# Patient Record
Sex: Male | Born: 1985 | Race: White | Hispanic: No | Marital: Single | Smoking: Current some day smoker
Health system: Southern US, Community
[De-identification: ages and names within clinical notes are randomized; demographics above are authoritative.]

## PROBLEM LIST (undated history)

## (undated) DIAGNOSIS — F32A Depression, unspecified: Secondary | ICD-10-CM

## (undated) DIAGNOSIS — K449 Diaphragmatic hernia without obstruction or gangrene: Secondary | ICD-10-CM

## (undated) DIAGNOSIS — F419 Anxiety disorder, unspecified: Secondary | ICD-10-CM

## (undated) DIAGNOSIS — R51 Headache: Secondary | ICD-10-CM

## (undated) DIAGNOSIS — B009 Herpesviral infection, unspecified: Secondary | ICD-10-CM

## (undated) DIAGNOSIS — K2270 Barrett's esophagus without dysplasia: Secondary | ICD-10-CM

## (undated) DIAGNOSIS — F329 Major depressive disorder, single episode, unspecified: Secondary | ICD-10-CM

## (undated) HISTORY — DX: Barrett's esophagus without dysplasia: K22.70

## (undated) HISTORY — DX: Major depressive disorder, single episode, unspecified: F32.9

## (undated) HISTORY — DX: Depression, unspecified: F32.A

## (undated) HISTORY — DX: Herpesviral infection, unspecified: B00.9

## (undated) HISTORY — DX: Anxiety disorder, unspecified: F41.9

## (undated) HISTORY — DX: Headache: R51

---

## 2007-07-29 ENCOUNTER — Encounter: Admission: RE | Admit: 2007-07-29 | Discharge: 2007-07-29 | Payer: Self-pay | Admitting: Gastroenterology

## 2011-10-29 ENCOUNTER — Encounter (HOSPITAL_COMMUNITY): Payer: Self-pay | Admitting: Psychology

## 2011-10-29 ENCOUNTER — Ambulatory Visit (INDEPENDENT_AMBULATORY_CARE_PROVIDER_SITE_OTHER): Payer: 59 | Admitting: Psychology

## 2011-10-29 DIAGNOSIS — F329 Major depressive disorder, single episode, unspecified: Secondary | ICD-10-CM

## 2011-10-29 DIAGNOSIS — F1010 Alcohol abuse, uncomplicated: Secondary | ICD-10-CM

## 2011-10-29 NOTE — Progress Notes (Signed)
Charles Howe is a 25 y.o. male in treatment for depression and anxiety and displays the following risk factors for Suicide:  Demographic factors:  Male, Adolescent or young adult and Caucasian Current Mental Status: Suicidal ideation Loss Factors: Loss of significant relationship Historical Factors: Prior suicide attempts Risk Reduction Factors: Employed and Living with another person, especially a relative  CLINICAL FACTORS:  Depression:   Aggression Comorbid alcohol abuse/dependence Alcohol/Substance Abuse/Dependencies  COGNITIVE FEATURES THAT CONTRIBUTE TO RISK: Loss of executive function    SUICIDE RISK:  Mild:  Suicidal ideation of limited frequency, intensity, duration, and specificity.  There are no identifiable plans, no associated intent, mild dysphoria and related symptoms, good self-control (both objective and subjective assessment), few other risk factors, and identifiable protective factors, including available and accessible social support.  Mental Status:  General Appearance Charles Howe:  Neat Eye Contact:  Good Motor Behavior:  Normal Speech:  Normal Level of Consciousness:  Alert Mood:  Anxious Affect:  Appropriate Anxiety Level:  Moderate Thought Process:  Coherent, Relevant and Intact Thought Content:  WNL Perception:  Normal Judgment:  Good Insight:  Present Cognition:  Orientation time, place and person Memory Remote Concentration Yes Sleep: 5-6 hours, feels it is enough  Reviewed his self-reported history with a goal of developing rapport and identifying immediate needs.  Onset of current problem is 2+ months ago when he engaged in Unacceptable, unfaithful behavior and his girlfriend confronted him about it.  She attributed it to his alcohol and drug use and gave the ultimatum of giving up marijuana and starting to work on some real goals for his life.  She is pursuing a nursing degree with a goal of becoming a nurse anesthetist.  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   He comes in today because he sees himself as having an anger problem, saying he is easily frustrated and when angry, "over little things" he hits things and has "some messed up knuckles".  During the past few months, he lost his appetite and lost 30 lbs, but has regained 10.   He worries a lot about his physical health, having Barrett's disease and a hiatal hernia  "related to smoking and alcohol".  He lives with his mother and step father (who drinks and smokes marijuana daily).  He pays his own bills, but is also beginning to need to help them out.  Onset of his own difficulties he reports to be the death of his father when he was 10.  He says his father had been an alcoholic, but had stopped drinking, went to AA and a short time later died in his sleep of what was mixing pain meds with other substances.  He had a lot of difficulty with his behavior in school after that.  The stepdad came into their lives two weeks after his father died and married his mother a year later.  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  He denies and current or past history of blackouts, DUIs or other legal problems.  He likes to bowl weekly and play golf as often as he can afford to.  He says he was good at drawing in school, because that was what he did most of the time during classes.  His completion of the paperwork initially indicates lack of attention to details or high anxiety.  He reports one prior attempt at therapy, "but she wanted me to stop drinking and give up marijuana and that was not why I was there."  Current stressors include a boss who smokes marijuana at work, the attempt to please his girlfriend and a lack of feeling worthy of her, his health issues and the suicidal ideation that comes at times.  His has a friend who is taking him to church, something he  never did with his family.  He says it makes him feel hopeful and he enjoys going.  Goals discussed for our work together:  Experience motivation to do things;  Experience less frustration and behave with less anger; "I want to be happy without a buzz"  PLAN OF CARE:Education provided about the increased risk of suicidal gestures if using mind altering substances like alcohol due to loss of executive function.  Patient has wisely avoided purchase of a gun, even for hunting (uses bow to hunt dear) and does not have immediate acces to firearms in his home. Only attempt was at age 67 with the chain of a watch which broke when he put weight on it.  Denies any current intent or even thoughts at this time.  Most recent was several months ago when his girlfriend broke up with him.  I outlined the expectations from therapy, the limited time frame (6-8 session), the active participation needed including with homework, and the benefit he might obtain from having support and coaching as he makes changes.  I think he may need further assessment for ADD in the future.  I did not talk about medication for depression as our session was shorter than expected because he spent the first part of the hour working on the paperwork.   [facility], RN 10/29/2011, 4:37 PM

## 2011-12-12 ENCOUNTER — Ambulatory Visit (HOSPITAL_COMMUNITY): Payer: 59 | Admitting: Psychology

## 2012-01-16 ENCOUNTER — Encounter (HOSPITAL_COMMUNITY): Payer: Self-pay | Admitting: Psychology

## 2012-01-16 ENCOUNTER — Ambulatory Visit (INDEPENDENT_AMBULATORY_CARE_PROVIDER_SITE_OTHER): Payer: 59 | Admitting: Psychology

## 2012-01-16 DIAGNOSIS — F3289 Other specified depressive episodes: Secondary | ICD-10-CM

## 2012-01-16 DIAGNOSIS — F329 Major depressive disorder, single episode, unspecified: Secondary | ICD-10-CM

## 2012-01-16 DIAGNOSIS — F411 Generalized anxiety disorder: Secondary | ICD-10-CM

## 2012-01-16 DIAGNOSIS — F1010 Alcohol abuse, uncomplicated: Secondary | ICD-10-CM

## 2012-01-16 DIAGNOSIS — F32A Depression, unspecified: Secondary | ICD-10-CM

## 2012-01-16 NOTE — Progress Notes (Signed)
   THERAPIST PROGRESS NOTE  Session Time: 1505 - 1615  Participation Level: Active  Behavioral Response: Casual and Well GroomedAlertDepressed  Type of Therapy: Individual Therapy  Treatment Goals addressed: Anxiety and Coping  Interventions: CBT  Summary: Charles Howe is a 26 y.o. male who presents with sadness and remorse over having cheated with another woman when he was very drunk and thus lost his girlfriend.  This happened about one month ago, "I don't know why I did it", and is the third time this has happened, each time related to excessive alcohol intake.  He is angry with himself, ruminating about the "why", not eating, not sleeping well, and having difficulty concentrating at school and at work.  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  He continues to work and denies missing work or school related to recent problems.  He has been tearful a lot in situations where he would not want to be. He cannot help himself from checking her facebook page daily and looking for messages from her.  While in our session he did receive a reply to a text message he sent because today is her birthday (21) and then had a call from her.  He was very appropriate and our session was just ending anyway.. Further family history indicates his anxiety disorder is at the baseline and is something his mother also suffers with.  On occasion she will give him a benzodiazepine, which works well for him.  He is currently suffering a great deal and has used marijuana to deal with it on occasion.  Suicidal/Homicidal: Yes , without intent/plan   "I think about it all the time, but I know I wouldn't do anything".  This is much the same answer as on first interview.  Therapist Response:  We talked over the circumstances, as well as the pattern of drinking too much and behaving badly.  It is not impossible that his friends could have set him up, but he doesn't like to think  that.    Suggested inpatient admission:, suggested medication via PCP, suggested that he start reading books by Allyson Sabal dealing with anxiety treatment using CBT.  He did not want to come inpatient or take medication.  Gave him written note listing names.  Also suggested aerobic exercise to help treat his anxiety and difficulty sleeping, take his dog for long walks, running, etc.  Contracted with him to stop using alcohol even on the weekends for the time being, at least.  He agreed to this.  Plan: Return again in 2 weeks.  Diagnosis: Axis I: Generalized Anxiety Disorder    Axis II: Deferred    Theola Cuellar, RN 01/16/2012

## 2012-01-31 ENCOUNTER — Ambulatory Visit (INDEPENDENT_AMBULATORY_CARE_PROVIDER_SITE_OTHER): Payer: 59 | Admitting: Psychology

## 2012-01-31 ENCOUNTER — Ambulatory Visit (HOSPITAL_COMMUNITY): Payer: Self-pay | Admitting: Psychology

## 2012-01-31 DIAGNOSIS — T50904A Poisoning by unspecified drugs, medicaments and biological substances, undetermined, initial encounter: Secondary | ICD-10-CM

## 2012-01-31 DIAGNOSIS — F1994 Other psychoactive substance use, unspecified with psychoactive substance-induced mood disorder: Secondary | ICD-10-CM

## 2012-01-31 NOTE — Progress Notes (Signed)
   THERAPIST PROGRESS NOTE  Session Time: 1305 - 1345  Participation Level: Active  Behavioral Response: CasualDrowsyDepressed  Type of Therapy: Individual Therapy  Treatment Goals addressed: Coping and Diagnosis: substance abuse  Interventions: CBT, Solution Focused, Reframing and Other: confrontation and challenge  Summary: Charles Howe is a 26 y.o. male who presents with "feeling more depressed the last few days.  Missed school one day and work two days".  Revealed that he began feeling more hopeless when girlfriend (estranged) was firm about not planning to see him again.  Admits he has been drinking and smoking marijuana with his friends and got drunk last night.  Admits that these behaviors are interfering with school and work.  Does not admit that he has a problem with substances. Denies intent to hurt himself.  States he did try Lexapro, given by his PCP ("she didn't like the suggestion of Remeron"), and took it for four days, but it made him feel "out of it", while also lessening his ruminations .  States he does not want to try medication again.  Suicidal/Homicidal: No, without intent/plan  Therapist Response: Confronted him about medicating on his own with alcohol and marijuana while still telling me that when he drinks he makes poor decisions.  Reviewed the other things we talked about last week that might help him feel less depressed.  He didn't try anything because of his lack of motivation.  Explained the negative thinking connected with feeling worse and we did some reframing of these statements.  Also did  A solution oriented imagining of what it would be like to actually feel better and have motivation.  Reminded him about the depressant effect of his substance use, which he agreed with.   Finally, challenged him to try an experiment to determine how he would feel if he did not smoke or drink for just 14 days.  His objection was that his parents both smoke and drink and so do  his friends, so it would be hard for him.  Offered suggestion that he stay with someone else while trying the experiment.  He said the family that has been taking him to church offered for him to stay "if I needed to get away.  But I wouldn't want to be a burden on them."  Suggested they might really feel good about helping him, rather than burdened.  Left the ball in his court, to follow through or not.  Plan: Return again in 3 weeks.  Diagnosis: Axis I: Substance Induced Mood Disorder    Axis II: No diagnosis    Charles Alen, RN 01/31/2012

## 2012-02-25 ENCOUNTER — Ambulatory Visit (INDEPENDENT_AMBULATORY_CARE_PROVIDER_SITE_OTHER): Payer: 59 | Admitting: Psychology

## 2012-02-25 DIAGNOSIS — F411 Generalized anxiety disorder: Secondary | ICD-10-CM

## 2012-02-25 NOTE — Progress Notes (Signed)
   THERAPIST PROGRESS NOTE  Session Time: 1410 - 1550  Participation Level: Active  Behavioral Response: CasualAlertAnxious and Depressed  Type of Therapy: Individual Therapy  Treatment Goals addressed: Anxiety and Coping  Interventions: Supportive, Reframing and Other: alcohol and drug issues discussed  Summary: Charles Howe is a 26 y.o. male who presents with continued obsessive thinking about ex-girlfriend.  This is now exacerbated because she called him a week ago wanting to get together.  He said "no", but since then sees her acting foolishly with regard to alcohol and other men and can't stop thinking about her.  He says he has let her know that he is worried about her behavior, but she tells him it is none of his business.  He says he finds himself going to work and school and doing nothing else, but smoking marijuana in his room alone.  Agrees that he needs to be trying some activity.  Since the Lexapro caused him to have more anxiety, he tried talking to his Dr. About something else for anxiety, but she told him no.  He agreed to see provider in this office to discuss options.  His mother takes Diazepam and sometimes gives him 1/2 tab. of hers which is effective.  Suicidal/Homicidal: Nowithout intent/plan  Therapist Response:  Clarified extent of drug use:  Now back to using daily, and for most of the past 2-3 years. We talked about the pros and cons of this use and he admits to beginning to see his motivation for school decreasing.  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  There is no homework.  Talked about his own goals for his future -- get a high paying job seems to be the only one.  He has bought a Sales promotion account executive recently and admits to little concern for safety when riding.  Talked with him about using exercise to help himself with anxiety and depression.  One option he has is running when the weather improves.   Plan: Return again in 2-3  weeks.  Set up appointment with medication provider in this office.   Homework: come up with at least one other activity he will do when not working.    Diagnosis: Axis I: Generalized Anxiety Disorder,  Marijuana and alcohol abuse    Axis II: deferred    [facility], RN 02/25/2012

## 2012-03-24 ENCOUNTER — Ambulatory Visit (HOSPITAL_COMMUNITY): Payer: Self-pay | Admitting: Psychology

## 2012-03-27 ENCOUNTER — Ambulatory Visit (HOSPITAL_COMMUNITY): Payer: Self-pay | Admitting: Psychology

## 2012-04-15 ENCOUNTER — Ambulatory Visit (HOSPITAL_COMMUNITY): Payer: Self-pay | Admitting: Physician Assistant

## 2012-09-10 ENCOUNTER — Ambulatory Visit
Admission: RE | Admit: 2012-09-10 | Discharge: 2012-09-10 | Disposition: A | Discharge status: Home / Self Care | Payer: 59 | Source: Ambulatory Visit | Attending: Family Medicine | Admitting: Family Medicine

## 2012-09-10 ENCOUNTER — Other Ambulatory Visit: Payer: Self-pay | Admitting: Family Medicine

## 2012-09-10 DIAGNOSIS — R52 Pain, unspecified: Secondary | ICD-10-CM

## 2018-09-25 DIAGNOSIS — K219 Gastro-esophageal reflux disease without esophagitis: Secondary | ICD-10-CM | POA: Diagnosis not present

## 2018-09-25 DIAGNOSIS — K2270 Barrett's esophagus without dysplasia: Secondary | ICD-10-CM | POA: Diagnosis not present

## 2018-09-25 DIAGNOSIS — R05 Cough: Secondary | ICD-10-CM | POA: Diagnosis not present

## 2019-02-19 DIAGNOSIS — R05 Cough: Secondary | ICD-10-CM | POA: Diagnosis not present

## 2019-04-14 DIAGNOSIS — M25571 Pain in right ankle and joints of right foot: Secondary | ICD-10-CM | POA: Diagnosis not present

## 2019-06-02 ENCOUNTER — Ambulatory Visit (INDEPENDENT_AMBULATORY_CARE_PROVIDER_SITE_OTHER): Payer: 59 | Admitting: Pulmonary Disease

## 2019-06-02 ENCOUNTER — Encounter: Payer: Self-pay | Admitting: Pulmonary Disease

## 2019-06-02 ENCOUNTER — Ambulatory Visit (INDEPENDENT_AMBULATORY_CARE_PROVIDER_SITE_OTHER): Payer: Self-pay

## 2019-06-02 ENCOUNTER — Other Ambulatory Visit: Payer: Self-pay

## 2019-06-02 VITALS — BP 128/86 | HR 73 | Temp 98.3°F | Ht 69.0 in | Wt 192.4 lb

## 2019-06-02 DIAGNOSIS — K2100 Gastro-esophageal reflux disease with esophagitis, without bleeding: Secondary | ICD-10-CM

## 2019-06-02 DIAGNOSIS — R059 Cough, unspecified: Secondary | ICD-10-CM

## 2019-06-02 DIAGNOSIS — R05 Cough: Secondary | ICD-10-CM

## 2019-06-02 DIAGNOSIS — K219 Gastro-esophageal reflux disease without esophagitis: Secondary | ICD-10-CM | POA: Insufficient documentation

## 2019-06-02 DIAGNOSIS — R0989 Other specified symptoms and signs involving the circulatory and respiratory systems: Secondary | ICD-10-CM | POA: Insufficient documentation

## 2019-06-02 MED ORDER — DOXYCYCLINE HYCLATE 100 MG PO TABS: 100.0000 mg | ORAL_TABLET | Freq: Every day | ORAL | 0 refills | Status: DC

## 2019-06-02 MED ORDER — ALBUTEROL SULFATE HFA 108 (90 BASE) MCG/ACT IN AERS: 2.0000 | INHALATION_SPRAY | Freq: Four times a day (QID) | RESPIRATORY_TRACT | 1 refills | Status: DC | PRN

## 2019-06-02 NOTE — Assessment & Plan Note (Signed)
I have asked him to double up on his omeprazole to twice daily and also undertake precautions for reflux.

## 2019-06-02 NOTE — Patient Instructions (Signed)
Increase omeprazole 40 mg twice daily Other precautions for reflux including small meals, avoid foods that make reflux worse and at least 2 hours between your last meal and bedtime  Doxycycline 100 mg daily for 7 days Albuterol MDI 2 puffs every 6 hours as needed for shortness of breath or wheezing  If symptoms persist then call us and we will schedule breathing test

## 2019-06-02 NOTE — Assessment & Plan Note (Signed)
Chest x-ray personally reviewed which only shows prominent bronchovascular markings, no infiltrate. His symptoms of chest congestion are probably related to bronchitis which could be triggered by allergies or GERD/subclinical aspiration he does have a strong history of reflux due to Barrett's esophagus and had no hernia   We will treat him with doxycycline for 7 days Use albuterol on an as-needed basis, if symptoms persist we may consider PFTs Can cessation were strongly emphasized

## 2019-06-02 NOTE — Progress Notes (Signed)
Subjective:    Patient ID: Charles Howe, male    DOB: 07-19-1986, 33 y.o.   MRN: [id number]  HPI  Chief Complaint  Patient presents with  . Consult    Pt referred by Dr. Hyman HopesWebb for persistant cough x 1-2 months. Pt c/o prod cough with yellow mucus, chest congestion, sinus congestion, wheezing. Pt denies f/c/s, orthopnea, chest pain.     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 He works for a core and is currently on furlough due to the covered pandemic.  He reports significant allergies for many years during spring and fall which manifests as nasal congestion and itchy eyes.  He had these symptoms started about 3 months ago and his chest congestion has persisted since then.  He also reports a dry cough very occasionally productive of minimal amounts of yellow sputum.  He has occasional wheezing.  He does not describe significant dyspnea.  There is no history of chest pain, fevers, orthopnea or paroxysmal nocturnal dyspnea or pedal edema.  He has been social distancing and denies sick contacts  He smoked heavily from age [age] and now is only a social smoker, less than 10 packs/day. I note chest x-ray from 01/2008 which shows hyperinflation and no infiltrates He reports significant reflux and has a history of hiatal hernia and Barrett's esophagus.  He takes omeprazole daily   Past Medical History:  Diagnosis Date  . Anxiety   . Barrett's esophagus   . Depression   . Headache(784.0)   . HSV-1 infection     History reviewed. No pertinent surgical history.  No Known Allergies  Social History   Socioeconomic History  . Marital status: Unknown    Spouse name: Not on file  . Number of children: 0  . Years of education: 8513  . Highest education level: Not on file  Occupational History  . Occupation: Producer, television/film/videometalworker    Employer: [employer]    Comment: 5-6 years full time  Social Needs  . Financial resource strain: Not on file  . Food  insecurity    Worry: Not on file    Inability: Not on file  . Transportation needs    Medical: Not on file    Non-medical: Not on file  Tobacco Use  . Smoking status: Current Some Day Smoker    Packs/day: 1.50    Years: 5.00    Pack years: 7.50    Types: Cigarettes    Start date: 12/03/2003  . Tobacco comment: Social smoking   Substance and Sexual Activity  . Alcohol use: Yes    Alcohol/week: 2.0 - 12.0 standard drinks    Types: 2 - 12 Standard drinks or equivalent per week    Comment: may drink one or two beers weeknights and often 12 pack on Friday and Sat with friends  . Drug use: Yes    Frequency: 5.0 times per week    Types: Marijuana    Comment: stopped about 2 1/2 months ago, only occasional use since then  . Sexual activity: Yes    Partners: Female  Lifestyle  . Physical activity    Days per week: Not on file    Minutes per session: Not on file  . Stress: Not on file  Relationships  . Social Musicianconnections    Talks on phone: Not on file    Gets together: Not on file    Attends religious service: Not on file    Active member of club or  organization: Not on file    Attends meetings of clubs or organizations: Not on file    Relationship status: Not on file  . Intimate partner violence    Fear of current or ex partner: Not on file    Emotionally abused: Not on file    Physically abused: Not on file    Forced sexual activity: Not on file  Other Topics Concern  . Not on file  Social History Narrative  . Not on file     Family History  Problem Relation Age of Onset  . Anxiety disorder Mother   . Depression Mother    Lung cancer in a maternal grandfather  Review of Systems   Constitutional: negative for anorexia, fevers and sweats  Eyes: negative for irritation, redness and visual disturbance  Ears, nose, mouth, throat, and face: negative for earaches, epistaxis, nasal congestion and sore throat  Respiratory: negative for  dyspnea on exertion, sputum and  wheezing  Cardiovascular: negative for chest pain, dyspnea, lower extremity edema, orthopnea, palpitations and syncope  Gastrointestinal: negative for abdominal pain, constipation, diarrhea, melena, nausea and vomiting  Genitourinary:negative for dysuria, frequency and hematuria  Hematologic/lymphatic: negative for bleeding, easy bruising and lymphadenopathy  Musculoskeletal:negative for arthralgias, muscle weakness and stiff joints  Neurological: negative for coordination problems, gait problems, headaches and weakness  Endocrine: negative for diabetic symptoms including polydipsia, polyuria and weight loss     Objective:   Physical Exam   Gen. Pleasant, well-nourished, in no distress, normal affect ENT - no pallor,icterus, no post nasal drip Neck: No JVD, no thyromegaly, no carotid bruits Lungs: no use of accessory muscles, no dullness to percussion, clear without rales or rhonchi  Cardiovascular: Rhythm regular, heart sounds  normal, no murmurs or gallops, no peripheral edema Abdomen: soft and non-tender, no hepatosplenomegaly, BS normal. Musculoskeletal: No deformities, no cyanosis or clubbing, tattoos+  Neuro:  alert, non focal       Assessment & Plan:

## 2019-10-22 ENCOUNTER — Other Ambulatory Visit: Payer: Self-pay

## 2019-10-22 DIAGNOSIS — Z20822 Contact with and (suspected) exposure to covid-19: Secondary | ICD-10-CM

## 2019-10-25 ENCOUNTER — Other Ambulatory Visit: Payer: Self-pay

## 2019-10-25 DIAGNOSIS — Z20822 Contact with and (suspected) exposure to covid-19: Secondary | ICD-10-CM

## 2019-10-25 LAB — NOVEL CORONAVIRUS, NAA: SARS-CoV-2, NAA: NOT DETECTED

## 2019-10-27 LAB — NOVEL CORONAVIRUS, NAA: SARS-CoV-2, NAA: DETECTED — AB

## 2019-11-03 ENCOUNTER — Telehealth: Payer: Self-pay | Admitting: *Deleted

## 2019-11-03 NOTE — Telephone Encounter (Signed)
Charles Howe from [facility], call for the patient's contact number .

## 2020-01-14 ENCOUNTER — Ambulatory Visit (INDEPENDENT_AMBULATORY_CARE_PROVIDER_SITE_OTHER): Payer: 59 | Admitting: Psychology

## 2020-01-14 DIAGNOSIS — F331 Major depressive disorder, recurrent, moderate: Secondary | ICD-10-CM

## 2020-01-14 DIAGNOSIS — F411 Generalized anxiety disorder: Secondary | ICD-10-CM

## 2020-01-21 ENCOUNTER — Ambulatory Visit (INDEPENDENT_AMBULATORY_CARE_PROVIDER_SITE_OTHER): Payer: 59 | Admitting: Psychology

## 2020-01-21 DIAGNOSIS — F331 Major depressive disorder, recurrent, moderate: Secondary | ICD-10-CM | POA: Diagnosis not present

## 2020-02-03 ENCOUNTER — Ambulatory Visit (INDEPENDENT_AMBULATORY_CARE_PROVIDER_SITE_OTHER): Payer: 59 | Admitting: Psychology

## 2020-02-03 DIAGNOSIS — F331 Major depressive disorder, recurrent, moderate: Secondary | ICD-10-CM | POA: Diagnosis not present

## 2020-02-09 ENCOUNTER — Other Ambulatory Visit: Payer: Self-pay | Admitting: Family Medicine

## 2020-02-09 ENCOUNTER — Ambulatory Visit
Admission: RE | Admit: 2020-02-09 | Discharge: 2020-02-09 | Disposition: A | Discharge status: Home / Self Care | Payer: 59 | Source: Ambulatory Visit | Attending: Family Medicine | Admitting: Family Medicine

## 2020-02-09 DIAGNOSIS — R0789 Other chest pain: Secondary | ICD-10-CM

## 2020-02-11 ENCOUNTER — Ambulatory Visit (INDEPENDENT_AMBULATORY_CARE_PROVIDER_SITE_OTHER): Payer: 59 | Admitting: Psychology

## 2020-02-11 DIAGNOSIS — F331 Major depressive disorder, recurrent, moderate: Secondary | ICD-10-CM | POA: Diagnosis not present

## 2020-02-17 ENCOUNTER — Ambulatory Visit (INDEPENDENT_AMBULATORY_CARE_PROVIDER_SITE_OTHER): Payer: 59 | Admitting: Psychology

## 2020-02-17 DIAGNOSIS — F331 Major depressive disorder, recurrent, moderate: Secondary | ICD-10-CM | POA: Diagnosis not present

## 2020-02-25 ENCOUNTER — Ambulatory Visit (INDEPENDENT_AMBULATORY_CARE_PROVIDER_SITE_OTHER): Payer: 59 | Admitting: Psychology

## 2020-02-25 DIAGNOSIS — F331 Major depressive disorder, recurrent, moderate: Secondary | ICD-10-CM | POA: Diagnosis not present

## 2020-03-31 ENCOUNTER — Ambulatory Visit: Payer: 59 | Admitting: Psychology

## 2020-04-07 ENCOUNTER — Ambulatory Visit: Payer: Self-pay | Admitting: Psychology

## 2020-04-21 ENCOUNTER — Ambulatory Visit (INDEPENDENT_AMBULATORY_CARE_PROVIDER_SITE_OTHER): Payer: 59 | Admitting: Psychology

## 2020-04-21 DIAGNOSIS — F331 Major depressive disorder, recurrent, moderate: Secondary | ICD-10-CM | POA: Diagnosis not present

## 2020-06-12 ENCOUNTER — Ambulatory Visit (INDEPENDENT_AMBULATORY_CARE_PROVIDER_SITE_OTHER): Payer: 59 | Admitting: Psychology

## 2020-06-12 DIAGNOSIS — F331 Major depressive disorder, recurrent, moderate: Secondary | ICD-10-CM

## 2020-06-13 ENCOUNTER — Ambulatory Visit: Payer: 59 | Admitting: Psychology

## 2020-06-23 IMAGING — CR DG RIBS W/ CHEST 3+V*L*
3 series · 3 of 3 positions shown · non-contrast
Comparison: June 02, 2019

CLINICAL DATA: Pain following recent snowboarding injury

EXAM:
LEFT RIBS AND CHEST - 3+ VIEW

[w chest pa]
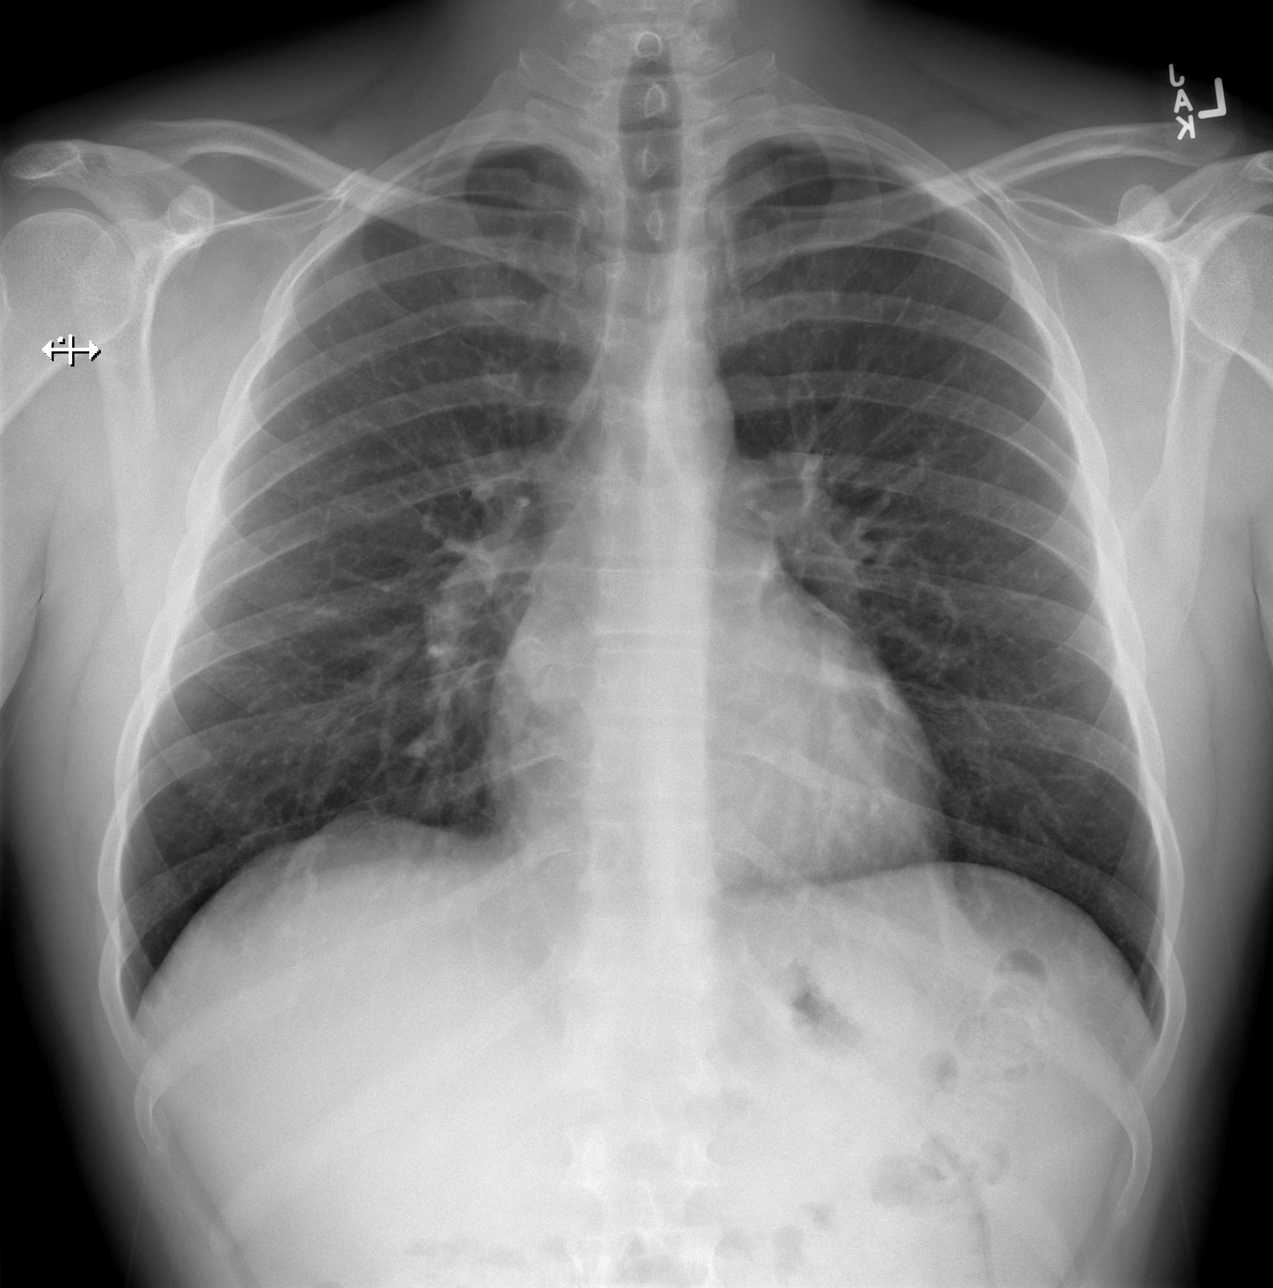

[w ribs ap upper left]
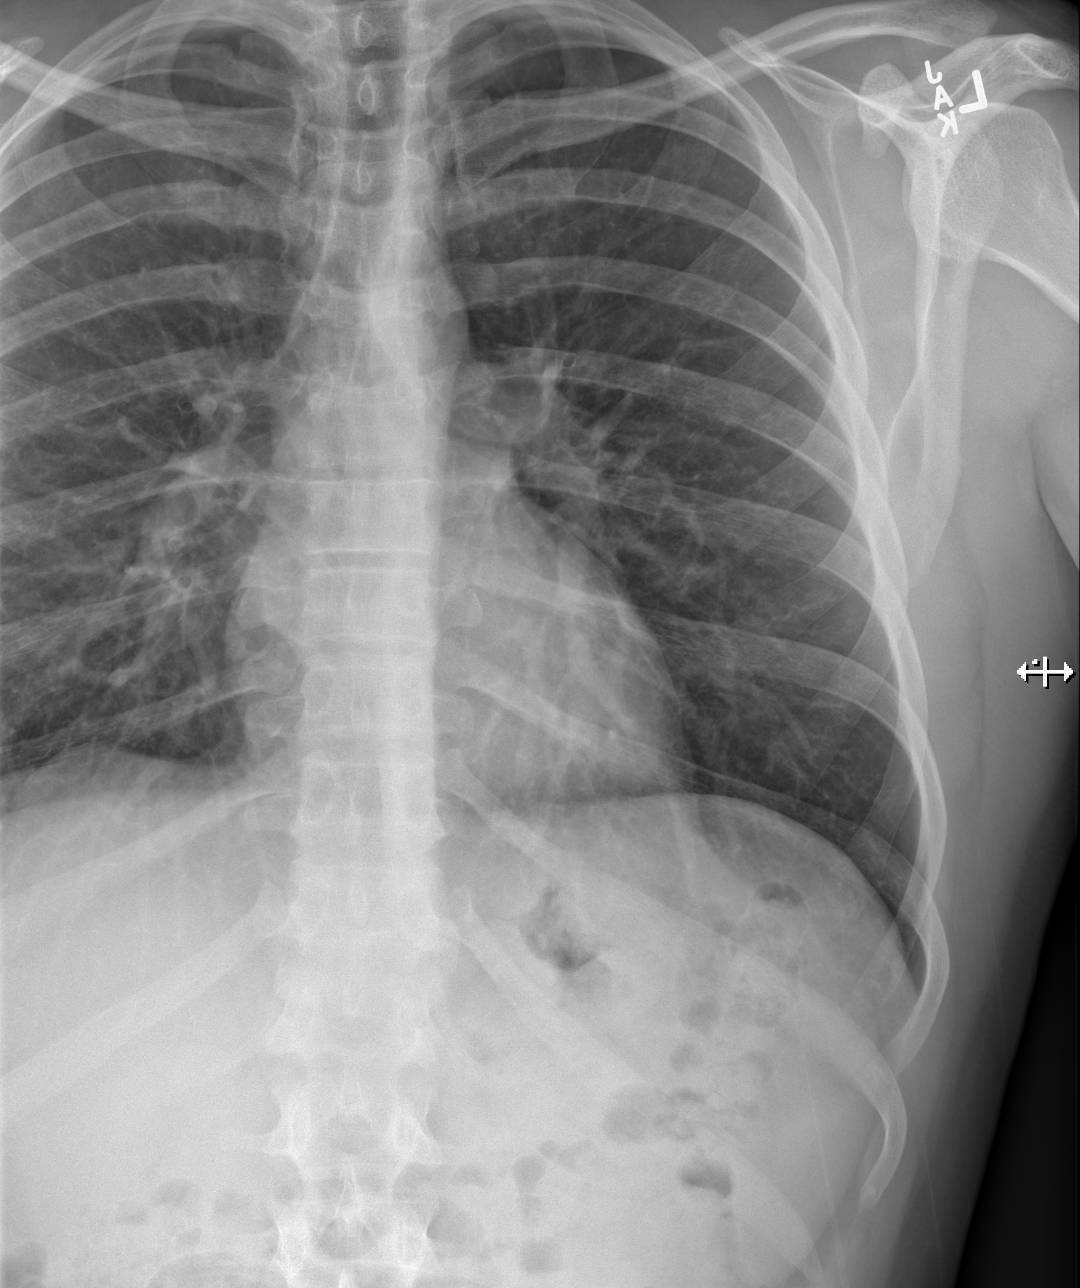

[w ribs ap lower left]
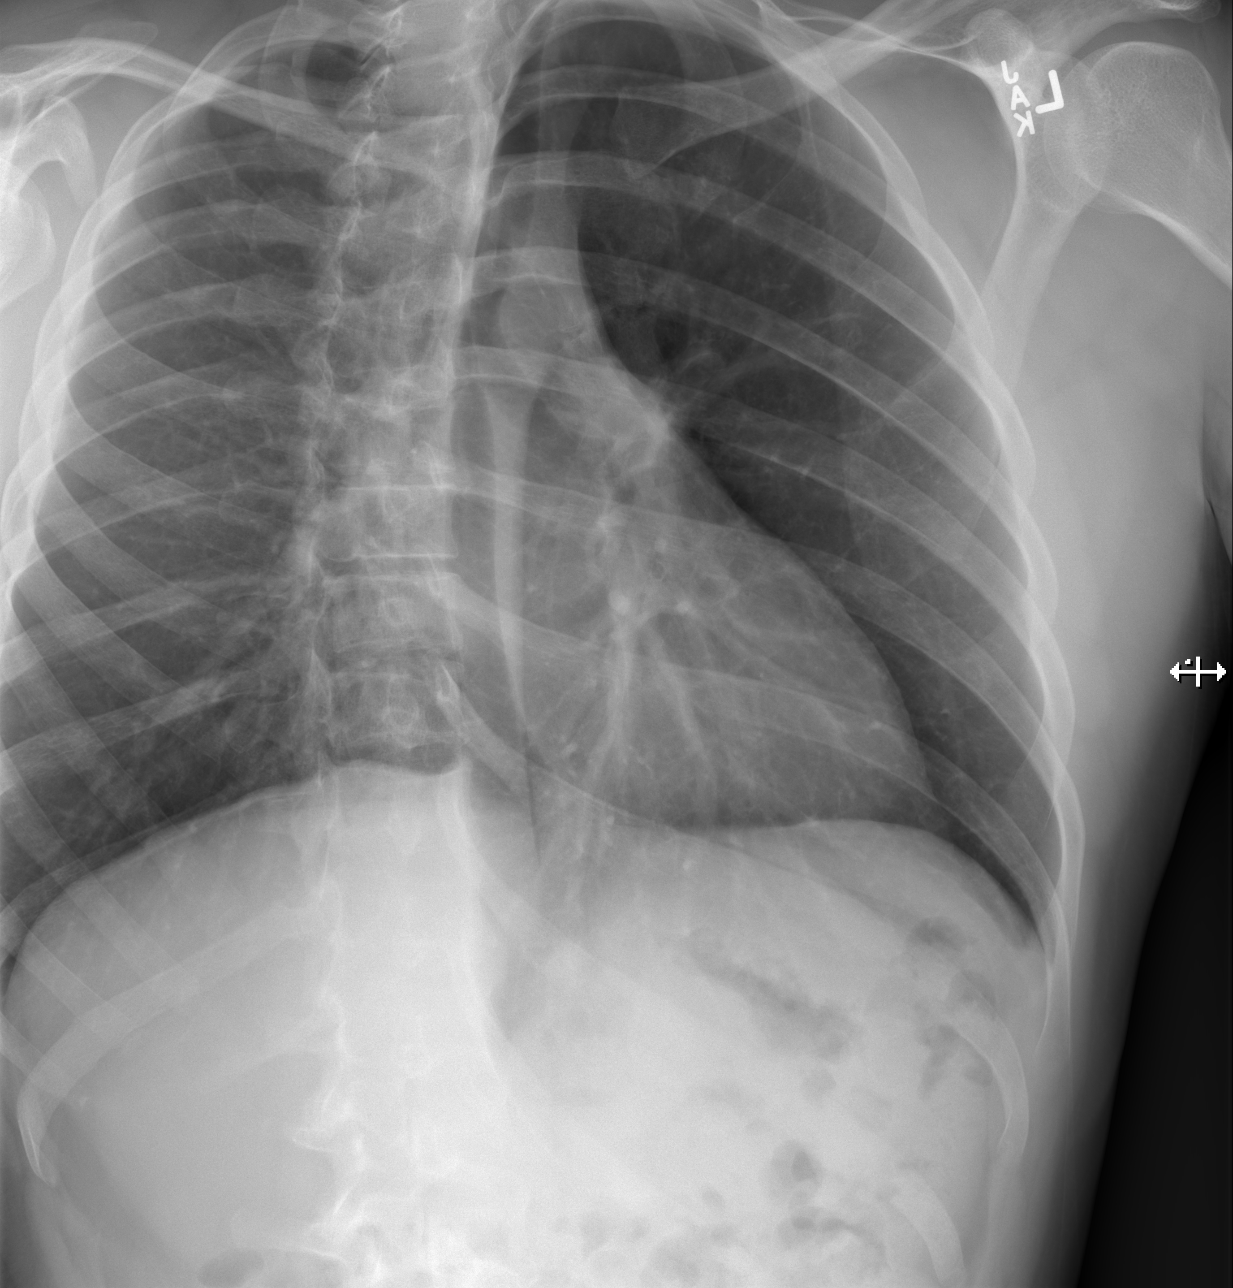

[3 of 3 positions shown; findings below may reference images not displayed]

FINDINGS: Frontal chest as well as oblique and cone-down rib images obtained.
Lungs are clear. Heart size and pulmonary vascularity are normal. No
adenopathy.

No pneumothorax or pleural effusion.  No evident rib fracture.
IMPRESSION: Lungs clear.  No evident rib fracture.

## 2021-03-28 ENCOUNTER — Encounter (HOSPITAL_BASED_OUTPATIENT_CLINIC_OR_DEPARTMENT_OTHER): Payer: Self-pay | Admitting: *Deleted

## 2021-03-28 ENCOUNTER — Emergency Department (HOSPITAL_BASED_OUTPATIENT_CLINIC_OR_DEPARTMENT_OTHER)
Admission: EM | Admit: 2021-03-28 | Discharge: 2021-03-28 | Disposition: A | Discharge status: Home / Self Care | Payer: Self-pay | Attending: Emergency Medicine | Admitting: Emergency Medicine

## 2021-03-28 ENCOUNTER — Other Ambulatory Visit: Payer: Self-pay

## 2021-03-28 ENCOUNTER — Emergency Department (HOSPITAL_BASED_OUTPATIENT_CLINIC_OR_DEPARTMENT_OTHER): Payer: Self-pay

## 2021-03-28 DIAGNOSIS — K2970 Gastritis, unspecified, without bleeding: Secondary | ICD-10-CM | POA: Insufficient documentation

## 2021-03-28 DIAGNOSIS — F17210 Nicotine dependence, cigarettes, uncomplicated: Secondary | ICD-10-CM | POA: Insufficient documentation

## 2021-03-28 DIAGNOSIS — A059 Bacterial foodborne intoxication, unspecified: Secondary | ICD-10-CM | POA: Insufficient documentation

## 2021-03-28 DIAGNOSIS — R1013 Epigastric pain: Secondary | ICD-10-CM | POA: Insufficient documentation

## 2021-03-28 HISTORY — DX: Diaphragmatic hernia without obstruction or gangrene: K44.9

## 2021-03-28 LAB — CBC WITH DIFFERENTIAL/PLATELET
Abs Immature Granulocytes: 0.17 10*3/uL — ABNORMAL HIGH (ref 0.00–0.07)
Basophils Absolute: 0.1 10*3/uL (ref 0.0–0.1)
Basophils Relative: 0 %
Eosinophils Absolute: 0.1 10*3/uL (ref 0.0–0.5)
Eosinophils Relative: 0 %
HCT: 44.3 % (ref 39.0–52.0)
Hemoglobin: 15.7 g/dL (ref 13.0–17.0)
Immature Granulocytes: 1 %
Lymphocytes Relative: 6 %
Lymphs Abs: 1.1 10*3/uL (ref 0.7–4.0)
MCH: 32.8 pg (ref 26.0–34.0)
MCHC: 35.4 g/dL (ref 30.0–36.0)
MCV: 92.7 fL (ref 80.0–100.0)
Monocytes Absolute: 1 10*3/uL (ref 0.1–1.0)
Monocytes Relative: 5 %
Neutro Abs: 16.6 10*3/uL — ABNORMAL HIGH (ref 1.7–7.7)
Neutrophils Relative %: 88 %
Platelets: 211 10*3/uL (ref 150–400)
RBC: 4.78 MIL/uL (ref 4.22–5.81)
RDW: 12.9 % (ref 11.5–15.5)
WBC: 19 10*3/uL — ABNORMAL HIGH (ref 4.0–10.5)
nRBC: 0 % (ref 0.0–0.2)

## 2021-03-28 LAB — COMPREHENSIVE METABOLIC PANEL
ALT: 44 U/L (ref 0–44)
AST: 28 U/L (ref 15–41)
Albumin: 5 g/dL (ref 3.5–5.0)
Alkaline Phosphatase: 75 U/L (ref 38–126)
Anion gap: 10 (ref 5–15)
BUN: 12 mg/dL (ref 6–20)
CO2: 26 mmol/L (ref 22–32)
Calcium: 10 mg/dL (ref 8.9–10.3)
Chloride: 101 mmol/L (ref 98–111)
Creatinine, Ser: 0.79 mg/dL (ref 0.61–1.24)
GFR, Estimated: >60 mL/min (ref >60)
Glucose, Bld: 111 mg/dL — ABNORMAL HIGH (ref 70–99)
Potassium: 4.1 mmol/L (ref 3.5–5.1)
Sodium: 137 mmol/L (ref 135–145)
Total Bilirubin: 0.6 mg/dL (ref 0.3–1.2)
Total Protein: 7.8 g/dL (ref 6.5–8.1)

## 2021-03-28 LAB — LIPASE, BLOOD: Lipase: 37 U/L (ref 11–51)

## 2021-03-28 MED ORDER — FAMOTIDINE IN NACL 20-0.9 MG/50ML-% IV SOLN
20.0000 mg | Freq: Once | INTRAVENOUS | Status: AC
  Administered 2021-03-28: 20 mg via INTRAVENOUS
  Filled 2021-03-28: qty 50

## 2021-03-28 MED ORDER — FAMOTIDINE 20 MG PO TABS: 20.0000 mg | ORAL_TABLET | Freq: Two times a day (BID) | ORAL | 0 refills | Status: AC

## 2021-03-28 MED ORDER — LIDOCAINE VISCOUS HCL 2 % MT SOLN
15.0000 mL | Freq: Once | OROMUCOSAL | Status: AC
  Administered 2021-03-28: 15 mL via ORAL
  Filled 2021-03-28: qty 15

## 2021-03-28 MED ORDER — IOHEXOL 300 MG/ML  SOLN
75.0000 mL | Freq: Once | INTRAMUSCULAR | Status: AC | PRN
  Administered 2021-03-28: 75 mL via INTRAVENOUS

## 2021-03-28 MED ORDER — ONDANSETRON HCL 4 MG PO TABS: 4.0000 mg | ORAL_TABLET | Freq: Three times a day (TID) | ORAL | 0 refills | Status: AC | PRN

## 2021-03-28 MED ORDER — ONDANSETRON 4 MG PO TBDP
4.0000 mg | ORAL_TABLET | Freq: Once | ORAL | Status: AC
  Administered 2021-03-28: 4 mg via ORAL
  Filled 2021-03-28: qty 1

## 2021-03-28 MED ORDER — ALUM & MAG HYDROXIDE-SIMETH 200-200-20 MG/5ML PO SUSP
30.0000 mL | Freq: Once | ORAL | Status: AC
  Administered 2021-03-28: 30 mL via ORAL
  Filled 2021-03-28: qty 30

## 2021-03-28 MED ORDER — MORPHINE SULFATE (PF) 4 MG/ML IV SOLN
4.0000 mg | Freq: Once | INTRAVENOUS | Status: AC
  Administered 2021-03-28: 4 mg via INTRAVENOUS
  Filled 2021-03-28: qty 1

## 2021-03-28 NOTE — Discharge Instructions (Signed)
Stick to a liquid diet for the next 2 days.

## 2021-03-28 NOTE — ED Notes (Signed)
Pt reports that his epigastric pain has increased to a 6 out of 10. Pt states the pain is not radiating. Pt appears to more pale than on admission

## 2021-03-28 NOTE — ED Triage Notes (Signed)
Epigastric pain 2 hours ago and stated that he had food poisoning from [employer] last night . Vomited x 4 times since last night.  Also complaint of nausea.

## 2021-03-28 NOTE — ED Provider Notes (Signed)
[facility] EMERGENCY DEPT Provider Note   CSN: [id number] Arrival date & time: 03/28/21  1548     History Chief Complaint  Patient presents with  . Abdominal Pain    Charles Howe is a 35 y.o. male with a history of Barrett's esophagus, hiatal hernia, presented emergency department with nausea, vomiting and diarrhea.  The patient reports that he ate at a hibachi grill yesterday evening with his girlfriend.  They both went to bed immediately afterwards.  They both woke up approximately 2 hours later with significant diarrhea, nausea and vomiting, which lasted throughout the evening.  He tried to go to work today, was having persistent diarrhea, and also dry heaving.  His friend brought him to the ED.  He reports a "knot sensation" in his epigastrium, and is concerned it may be his hiatal hernia.  The pain is very persistent.  It seems to be better when he is laying flat, worse when sitting upright.  He has not had any vomiting recently.  He says the pain is very mild at this point.  No fevers or chills.  No history of abdominal surgeries.  He states he has had an endoscopy done in the past few years by a GI doctor and told his hiatal hernia looks okay, and that he won't need another procedure for "5 years."    HPI     Past Medical History:  Diagnosis Date  . Anxiety   . Barrett's esophagus   . Depression   . Headache(784.0)   . Hiatal hernia   . HSV-1 infection     Patient Active Problem List   Diagnosis Date Noted  . Chest congestion 06/02/2019  . GERD (gastroesophageal reflux disease) 06/02/2019    History reviewed. No pertinent surgical history.     Family History  Problem Relation Age of Onset  . Anxiety disorder Mother   . Depression Mother     Social History   Tobacco Use  . Smoking status: Current Some Day Smoker    Packs/day: 1.50    Years: 5.00    Pack years: 7.50    Types: Cigarettes    Start date: 12/03/2003  . Smokeless tobacco: Never  Used  . Tobacco comment: Social smoking   Vaping Use  . Vaping Use: Former  Substance Use Topics  . Alcohol use: Yes    Alcohol/week: 2.0 - 12.0 standard drinks    Types: 2 - 12 Standard drinks or equivalent per week    Comment: may drink one or two beers weeknights and often 12 pack on Friday and Sat with friends  . Drug use: Yes    Frequency: 5.0 times per week    Types: Marijuana    Comment: 2 days ago    Home Medications Prior to Admission medications   Medication Sig Start Date End Date Taking? Authorizing Provider  famotidine (PEPCID) 20 MG tablet Take 1 tablet (20 mg total) by mouth 2 (two) times daily. 03/28/21 04/27/21 Yes Tameem Pullara, Kermit Balo, MD  Multiple Vitamin (MULTIVITAMIN) capsule Take 1 capsule by mouth daily.   Yes [provider]  omeprazole (PRILOSEC) 40 MG capsule Take 40 mg by mouth daily.   Yes [provider]  ondansetron (ZOFRAN) 4 MG tablet Take 1 tablet (4 mg total) by mouth every 8 (eight) hours as needed for up to 15 doses for nausea or vomiting. 03/28/21  Yes August Gosser, Kermit Balo, MD    Allergies    Patient has no known allergies.  Review  of Systems   Review of Systems  Constitutional: Negative for chills and fever.  HENT: Negative for ear pain and sore throat.   Eyes: Negative for pain and visual disturbance.  Respiratory: Negative for cough and shortness of breath.   Cardiovascular: Negative for chest pain and palpitations.  Gastrointestinal: Positive for abdominal pain and nausea. Negative for vomiting.  Genitourinary: Negative for dysuria and hematuria.  Musculoskeletal: Negative for arthralgias and myalgias.  Skin: Negative for color change and rash.  Neurological: Negative for syncope and headaches.  All other systems reviewed and are negative.   Physical Exam Updated Vital Signs BP (!) 148/98 (BP Location: Left Arm)   Pulse 94   Temp (!) 97.5 F (36.4 C) (Oral)   Resp 18   Ht 5\' 9"  (1.753 m)   Wt 79.4 kg   SpO2 100%    BMI 25.84 kg/m   Physical Exam Constitutional:      General: He is not in acute distress. HENT:     Head: Normocephalic and atraumatic.  Eyes:     Conjunctiva/sclera: Conjunctivae normal.     Pupils: Pupils are equal, round, and reactive to light.  Cardiovascular:     Rate and Rhythm: Normal rate and regular rhythm.  Pulmonary:     Effort: Pulmonary effort is normal. No respiratory distress.  Abdominal:     General: There is no distension.     Tenderness: There is abdominal tenderness in the epigastric area. There is no guarding or rebound. Negative signs include Murphy's sign and McBurney's sign.  Skin:    General: Skin is warm and dry.  Neurological:     General: No focal deficit present.     Mental Status: He is alert. Mental status is at baseline.  Psychiatric:        Mood and Affect: Mood normal.        Behavior: Behavior normal.     ED Results / Procedures / Treatments   Labs (all labs ordered are listed, but only abnormal results are displayed) Labs Reviewed  COMPREHENSIVE METABOLIC PANEL - Abnormal; Notable for the following components:      Result Value   Glucose, Bld 111 (*)    All other components within normal limits  CBC WITH DIFFERENTIAL/PLATELET - Abnormal; Notable for the following components:   WBC 19.0 (*)    Neutro Abs 16.6 (*)    Abs Immature Granulocytes 0.17 (*)    All other components within normal limits  LIPASE, BLOOD    EKG None  Radiology CT Chest W Contrast  Result Date: 03/28/2021 CLINICAL DATA:  35 year old male with hiatal hernia. Forceful vomiting and concern for esophageal perforation. EXAM: CT CHEST WITH CONTRAST TECHNIQUE: Multidetector CT imaging of the chest was performed during intravenous contrast administration. CONTRAST:  75mL OMNIPAQUE IOHEXOL 300 MG/ML  SOLN COMPARISON:  None. FINDINGS: Cardiovascular: There is no cardiomegaly or pericardial effusion. The thoracic aorta is unremarkable. The origins of the great vessels of  the aortic arch appear patent. The central pulmonary arteries appear unremarkable. Mediastinum/Nodes: No hilar or mediastinal adenopathy. The esophagus and the thyroid gland are grossly unremarkable. No mediastinal air cyst fluid collection. Lungs/Pleura: Bibasilar linear atelectasis/scarring. No focal consolidation, pleural effusion, or pneumothorax. The central airways are patent. Upper Abdomen: No acute abnormality. Musculoskeletal: No chest wall abnormality. No acute or significant osseous findings. IMPRESSION: No acute intrathoracic pathology. No CT evidence of esophageal perforation. Electronically Signed   By: Elgie CollardArash  Radparvar M.D.   On: 03/28/2021 20:03  Procedures Procedures   Medications Ordered in ED Medications  alum & mag hydroxide-simeth (MAALOX/MYLANTA) 200-200-20 MG/5ML suspension 30 mL (30 mLs Oral Given 03/28/21 1623)    And  lidocaine (XYLOCAINE) 2 % viscous mouth solution 15 mL (15 mLs Oral Given 03/28/21 1624)  ondansetron (ZOFRAN-ODT) disintegrating tablet 4 mg (4 mg Oral Given 03/28/21 1620)  famotidine (PEPCID) IVPB 20 mg premix (0 mg Intravenous Stopped 03/28/21 1709)  morphine 4 MG/ML injection 4 mg (4 mg Intravenous Given 03/28/21 1704)  iohexol (OMNIPAQUE) 300 MG/ML solution 75 mL (75 mLs Intravenous Contrast Given 03/28/21 1918)    ED Course  I have reviewed the triage vital signs and the nursing notes.  Pertinent labs & imaging results that were available during my care of the patient were reviewed by me and considered in my medical decision making (see chart for details).  This patient complains of nausea, vomiting, diarrhea and epigastric/substernal pain for 24 hours.  This involves an extensive number of treatment options, and is a complaint that carries with it a high risk of complications and morbidity.  The differential diagnosis includes food-borne illness (most likely given his wife has identical GI symptoms, and they both ate at the same restaurant last  night), with exacerbation of gastritis vs hiatal hernia strangulation vs esophageal perforation  He has focal epigastric tenderness, NO ttp in RUQ or Murphy sign to suggest biliary disease.  I ordered, reviewed, and interpreted labs. WBC 19.0.  CMP and lipase unremarkable.  This is less likely acute pancreatitis, biliary disease, or hepatitis.  I ordered medication GI cocktail, IV morphine for pain and suspected gastritis I ordered imaging studies which included CT chest with contrast I independently visualized and interpreted imaging which showed no life-threatening abnormalities, no hiatal hernia, no perforation, and the monitor tracing which showed NSR Additional history was obtained from patient's wife at bedside  We discussed his leukocytosis which may be reactive to vomiting and some hemoconcentration, but is still quite high.  With his level of pain and discomfort, I suggested CT imaging as noted below.   Clinical Course as of 03/29/21 1028  Wed Mar 28, 2021  1655 This pain worsened after the GI cocktail.  He is quite uncomfortable.  I have ordered him some morphine.  I discussed obtaining a CT scan given his leukocytosis and his worsening pain, to evaluate for evidence of esophageal perforation or hiatal hernia strangulation.  He would like more time to decide whether he will pursue the scan, as he is worried about the financial cost of this.  We'll reassess after morphine [MT]  1828 Pain is improved with the patient is requesting CT scan.  Believe is reasonable.  Scan ordered.  I spoke to the CT tech and advised that I would like a CT chest and to include the hiatal hernia as much of the stomach as possible.  Primarily I am looking for evidence of an esophageal perforation or hernia incarceration. [MT]  2015 Reassessed the patient.  I discussed CT scan which was normal.  We do not see signs of a hiatal hernia.  There is possible it slid back in the normal positioning.  There is no evidence  of perforation. [MT]  2016 He reports his pain is still better.  He wants to go home.  I will start him on Pepcid and give him some Zofran as well.  Okay for discharge. [MT]    Clinical Course User Index [MT] Candy Leverett, Kermit Balo, MD    Final Clinical  Impression(s) / ED Diagnoses Final diagnoses:  Food poisoning  Gastritis, presence of bleeding unspecified, unspecified chronicity, unspecified gastritis type    Rx / DC Orders ED Discharge Orders         Ordered    famotidine (PEPCID) 20 MG tablet  2 times daily        03/28/21 2017    ondansetron (ZOFRAN) 4 MG tablet  Every 8 hours PRN        03/28/21 2017           Terald Sleeper, MD 03/29/21 1029

## 2023-06-12 ENCOUNTER — Other Ambulatory Visit (HOSPITAL_COMMUNITY): Payer: Self-pay | Admitting: Gastroenterology

## 2023-06-12 DIAGNOSIS — R112 Nausea with vomiting, unspecified: Secondary | ICD-10-CM

## 2024-07-26 ENCOUNTER — Other Ambulatory Visit: Payer: Self-pay

## 2024-07-26 ENCOUNTER — Ambulatory Visit: Payer: Self-pay

## 2024-07-26 ENCOUNTER — Emergency Department (HOSPITAL_BASED_OUTPATIENT_CLINIC_OR_DEPARTMENT_OTHER)
Admission: EM | Admit: 2024-07-26 | Discharge: 2024-07-26 | Disposition: A | Discharge status: Home / Self Care | Attending: Emergency Medicine | Admitting: Emergency Medicine

## 2024-07-26 DIAGNOSIS — S61311A Laceration without foreign body of left index finger with damage to nail, initial encounter: Secondary | ICD-10-CM | POA: Insufficient documentation

## 2024-07-26 DIAGNOSIS — Y92009 Unspecified place in unspecified non-institutional (private) residence as the place of occurrence of the external cause: Secondary | ICD-10-CM | POA: Diagnosis not present

## 2024-07-26 DIAGNOSIS — W268XXA Contact with other sharp object(s), not elsewhere classified, initial encounter: Secondary | ICD-10-CM | POA: Diagnosis not present

## 2024-07-26 MED ORDER — SILVER NITRATE-POT NITRATE 75-25 % EX MISC
1.0000 | Freq: Once | CUTANEOUS | Status: AC
  Administered 2024-07-26: 1 via TOPICAL
  Filled 2024-07-26: qty 10

## 2024-07-26 MED ORDER — LIDOCAINE-EPINEPHRINE (PF) 2 %-1:200000 IJ SOLN
10.0000 mL | Freq: Once | INTRAMUSCULAR | Status: AC
  Administered 2024-07-26: 10 mL
  Filled 2024-07-26: qty 20

## 2024-07-26 NOTE — ED Triage Notes (Signed)
 Pt POV after cutting tip of L index finger with razor blade while cutting piece of metal, wound wrapped PTA, bleeding controlled. Tetanus UTD.

## 2024-07-26 NOTE — ED Notes (Signed)

## 2024-07-26 NOTE — ED Provider Notes (Signed)
 [facility] EMERGENCY DEPARTMENT AT [facility] Provider Note   CSN: [id number] Arrival date & time: 07/26/24  1613     Patient presents with: Laceration   Tan Gunderman is a 38 y.o. male with past medical history significant for depression, anxiety, who does not take blood thinners who presents after cutting the side of the tip of his left index finger with a razor blade while cutting fabric at home.  He reports tetanus up-to-date.  He reports they have not been able to get the bleeding under control.    Laceration      Prior to Admission medications   Medication Sig Start Date End Date Taking? Authorizing Provider  famotidine  (PEPCID ) 20 MG tablet Take 1 tablet (20 mg total) by mouth 2 (two) times daily. 03/28/21 04/27/21  Cottie Donnice PARAS, MD  Multiple Vitamin (MULTIVITAMIN) capsule Take 1 capsule by mouth daily.    [provider]  omeprazole (PRILOSEC) 40 MG capsule Take 40 mg by mouth daily.    [provider]  ondansetron  (ZOFRAN ) 4 MG tablet Take 1 tablet (4 mg total) by mouth every 8 (eight) hours as needed for up to 15 doses for nausea or vomiting. 03/28/21   Trifan, Donnice PARAS, MD    Allergies: Patient has no known allergies.    Review of Systems  All other systems reviewed and are negative.   Updated Vital Signs BP (!) 129/92 (BP Location: Right Arm)   Pulse (!) 106   Temp 98.1 F (36.7 C)   Resp 15   Ht 5' 9 (1.753 m)   Wt 74.8 kg   SpO2 96%   BMI 24.37 kg/m   Physical Exam Vitals and nursing note reviewed.  Constitutional:      General: He is not in acute distress.    Appearance: Normal appearance.  HENT:     Head: Normocephalic and atraumatic.  Eyes:     General:        Right eye: No discharge.        Left eye: No discharge.  Cardiovascular:     Rate and Rhythm: Normal rate and regular rhythm.  Pulmonary:     Effort: Pulmonary effort is normal. No respiratory distress.  Musculoskeletal:        General: No deformity.   Skin:    General: Skin is warm and dry.     Comments: Around 1.5x2.5 cm area of avulsion on the lateral aspect of the left index finger  Neurological:     Mental Status: He is alert and oriented to person, place, and time.  Psychiatric:        Mood and Affect: Mood normal.        Behavior: Behavior normal.     (all labs ordered are listed, but only abnormal results are displayed) Labs Reviewed - No data to display  EKG: None  Radiology: No results found.   .Laceration Repair  Date/Time: 07/26/2024 5:52 PM  Performed by: Rosan Sherlean DEL, PA-C Authorized by: Rosan Sherlean DEL, PA-C   Consent:    Consent obtained:  Verbal   Consent given by:  Patient   Risks, benefits, and alternatives were discussed: yes     Risks discussed:  Infection, pain, need for additional repair, poor cosmetic result, poor wound healing and nerve damage   Alternatives discussed:  No treatment Universal protocol:    Procedure explained and questions answered to patient or proxy's satisfaction: yes     Patient identity confirmed:  Verbally with  patient Anesthesia:    Anesthesia method:  Local infiltration   Local anesthetic:  Lidocaine  2% WITH epi Laceration details:    Location:  Finger   Finger location:  L index finger   Length (cm):  2.5 Exploration:    Hemostasis achieved with:  Cautery Treatment:    Area cleansed with:  Shur-Clens   Amount of cleaning:  Standard Skin repair:    Repair method:  Tissue adhesive (pressure dressing and coband for secondary intetion healing after silver  nitrate chemical cautery) Approximation:    Approximation:  Loose Repair type:    Repair type:  Simple Post-procedure details:    Dressing:  Non-adherent dressing   Procedure completion:  Tolerated    Medications Ordered in the ED  lidocaine -EPINEPHrine  (XYLOCAINE  W/EPI) 2 %-1:200000 (PF) injection 10 mL (has no administration in time range)  silver  nitrate applicators applicator 1  Application (has no administration in time range)                                    Medical Decision Making Risk Prescription drug management.   This is an overall well-appearing 38yo male who presents with a laceration of the left index finger.  The wound was explored through full range of motion, there is no evidence of foreign body, fascia rupture, damage to the deep vessels, capillary refill is intact distal to the site of the laceration.  Patient is up-to-date on their tetanus.  They are neurovascularly intact throughout on my exam, intact strength of the affected extremity.   Wound was extensively cleaned, and repaired as described above.  Chemical cautery with silver  nitrate followed by nonadherent petrolatum dressing, Kerlix, and Coban for wound that will heal by secondary intention.  Patient tolerated the procedure without difficulty.  Nonadherent dressing was placed.  Patient instructed to monitor for signs of infection including worsening pain, redness, pus draining from the affected site.  Instructed to keep the wound clean, bandage, and change the bandage at least once daily.  Patient understands and agrees to the plan, and is discharged in stable condition at this time.  Final diagnoses:  Laceration of left index finger without foreign body with damage to nail, initial encounter    ED Discharge Orders     None          Rosan Sherlean VEAR DEVONNA 07/26/24 1754    Jerrol Agent, MD 07/28/24 1154

## 2024-07-26 NOTE — ED Notes (Signed)
 Silver  nitrate and and lidocaine  at bedside

## 2024-07-26 NOTE — Telephone Encounter (Signed)
  FYI Only or Action Required?: FYI only for provider.  Patient was last seen in primary care on N/A.  Called Nurse Triage reporting Finger Injury.  Symptoms began 1.5 hours prior to triage with this RN.  Interventions attempted: Other: wrapped finger and went to Urgent Care and [facility]---advised to go to [facility] ER.  Symptoms are: unchanged.  Triage Disposition: Go to ED Now (Notify PCP)  Patient/caregiver understands and will follow disposition?: Yes                        Copied from CRM #[id number]. Topic: Clinical - Red Word Triage >> Jul 26, 2024  3:28 PM Farrel B wrote: [facility] that prompted transfer to Nurse Triage: patient cut piece off the tip of his finger Reason for Disposition  Skin is split open or gaping (or length > 1/2 inch or 12 mm)  Answer Assessment - Initial Assessment Questions Happened around 2 PM today 07/26/2024 Patient has it wrapped Up to date on tetanus--patient states he had one last year Patient went to [facility] and then to [facility] suggested [facility] for patient Patient is advised that this RN agrees with that suggestion to get this evaluated at this time Patient states he is headed to the [facility] ER   1. MECHANISM: How did the injury happen?      Accidentally cut with a razor blade 2. ONSET: When did the injury happen? (e.g., minutes, hours ago)      Geographical information systems officer  3. LOCATION: What part of the finger is injured? Is the nail damaged?      Pointer finger on left hand 4. APPEARANCE of the INJURY: What does the injury look like?      Patient has it wrapped up but states a razor cut the side of his 2nd digiti (pointer finger) of his left hand and it was on the side of the nail from the end of the finger down to below the line of the nail---patient has it wrapped at this time 5. SEVERITY: Can you use the hand normally?  Can you bend your fingers into a ball and then fully open  them?     ----- 6. SIZE: For cuts, bruises, or swelling, ask: How large is it? (e.g., inches or centimeters;  entire finger)      ------- 7. PAIN: Is there pain? If Yes, ask: How bad is the pain?  (Scale 0-10; or none, mild, moderate, severe)     ----- 8. TETANUS: For any breaks in the skin, ask: When was your last tetanus booster?     U[p to date within the last year 9. OTHER SYMPTOMS: Do you have any other symptoms?     No  Protocols used: Finger Injury-A-AH

## 2024-07-26 NOTE — ED Notes (Signed)
 ED Provider at bedside.

## 2024-07-26 NOTE — Discharge Instructions (Signed)
 I would place a bandage on the affected area and change it at least once daily as we discussed, or more frequently if it becomes soiled. When changing bandage, clean the wound thoroughly with soap and water, and place a thin layer of Polysporin or Neosporin directly over top of the wound before replacing bandage. Please monitor for any signs of developing infection including worsening redness, pain, pus draining from the affected site.  If you are concerned about any developing infection I recommend that you return for further evaluation for management.

## 2024-08-12 ENCOUNTER — Ambulatory Visit (HOSPITAL_COMMUNITY): Admission: EM | Admit: 2024-08-12 | Discharge: 2024-08-12 | Disposition: A | Discharge status: Home / Self Care

## 2024-08-12 DIAGNOSIS — F22 Delusional disorders: Secondary | ICD-10-CM | POA: Diagnosis not present

## 2024-08-12 DIAGNOSIS — Z79899 Other long term (current) drug therapy: Secondary | ICD-10-CM | POA: Diagnosis not present

## 2024-08-12 DIAGNOSIS — Z639 Problem related to primary support group, unspecified: Secondary | ICD-10-CM | POA: Diagnosis not present

## 2024-08-12 NOTE — Progress Notes (Signed)
   08/12/24 1914  [facility] Triage Screening (Walk-ins at [facility] only)  How Did You Hear About Us ? Other (Comment)  What Is the Reason for Your Visit/Call Today? Pt presents to [facility] as a voluntary walk-in, accompanied by friend with complaint of paranoia and depression. Pt reports relationship issues as immediate trigger at this time. Pt reports history of depression and anxiety. Pt denies taking prescribed medication at this time, but has in the past. [facility] is [facility] with [facility] and attends weekly sessions. Pt denies prior suicide attempts, impatient hospitalization and self-injurious behaviors. Pt currently denies SI,HI,AVH and substance/alcohol use.  How Long Has This Been Causing You Problems? 1 wk - 1 month  Have You Recently Had Any Thoughts About Hurting Yourself? No  Are You Planning to Commit Suicide/Harm Yourself At This time? No  Have you Recently Had Thoughts About Hurting Someone Sherral? No  Are You Planning To Harm Someone At This Time? No  Physical Abuse Denies  Verbal Abuse Denies  Sexual Abuse Denies  Exploitation of patient/patient's resources Denies  Self-Neglect Denies  Possible abuse reported to:  (NA)  Are you currently experiencing any auditory, visual or other hallucinations? No  Have You Used Any Alcohol or Drugs in the Past 24 Hours? No  Do you have any current medical co-morbidities that require immediate attention? No  Clinician description of patient physical appearance/behavior: cooperative, calm, casually dressed  What Do You Feel Would Help You the Most Today? Treatment for Depression or other mood problem  If access to [facility] was not available, would you have sought care in the Emergency Department? No  Determination of Need Routine (7 days)  Options For Referral Other: Comment;Medication Management;Outpatient Therapy

## 2024-08-12 NOTE — Discharge Instructions (Signed)
 Follow up with resources provided

## 2024-08-12 NOTE — ED Provider Notes (Signed)
 [facility] Medical Screening Exam  Patient Name: Charles Howe MRN: [id number] Date of Evaluation: 08/13/24 Chief Complaint:  having relationship problems with girlfriend Diagnosis:  Final diagnoses:  Relationship problems  Paranoid reaction (HCC)  Medication management    History of Present illness: Charles Howe is a 38 y.o. male. With a history of depression and was prescribed BuSpar by his PCP.  According to the patient he stopped taking it after a week as he did not make him feel good.  Patient presented to [facility], voluntarily.  Per the patient he has been having relationship issues with his girlfriend because he believed that she slept with the neighbor and he noticed that she has been standing most of the time at the window staring over at the neighbor's house.  According to the patient he eventually broke up with her but then got back together within a week.  According to patient this is causing him enough stress because when he broke up with her she called him and told him that she was going to cut her risk.  Patient stated that this make him feel bad like he is a bad guy.  According to patient he cannot dismiss the thought of her sleeping with a neighbor.  According to patient he lives alone currently works and he does have his son on the weekend which is from the next relationship.  Face-to-face evaluation of patient, patient is alert and oriented x 4, speech is clear, maintain eye contact.  Patient is a bit anxious but overall pleasant to talk to.  Patient denies SI, HI, AVH.  Patient reported sometimes he does feel paranoid because he has it in his mind that his girlfriend is still cheating with him on the neighbor and he cannot get it out of his head because she cheated on him before.  Patient denied wanting to hurt himself or others stating that he would never do anything to hurt anyone.  Writer discussed with patient that he should have a talk with his PCP about the  medicine before he discontinued it.  Also Clinical research associate did give patient resources for outpatient therapy and psychiatrist where he should reach out to see if they are taking new patients.  Patient was advised to call 911 or return to the nearest ED should he experience any suicidal thoughts or homicidal ideation or hallucination.  Patient verbalized understanding.  Recommend discharge for patient to follow-up with resources provided and or his PCP  Flowsheet Row ED from 08/12/2024 in [facility] ED from 07/26/2024 in [facility] Department at [facility] ED from 03/28/2021 in [facility] Emergency Department at [facility]  C-SSRS RISK CATEGORY No Risk No Risk No Risk    Psychiatric Specialty Exam  Presentation  Charles Appearance:Casual  Eye Contact:Good  Speech:Clear and Coherent  Speech Volume:Normal  Handedness:No data recorded  Mood and Affect  Mood: Anxious  Affect: Appropriate   Thought Process  Thought Processes: Coherent  Descriptions of Associations:Intact  Orientation:Full (Time, Place and Person)  Thought Content:Logical    Hallucinations:None  Ideas of Reference:None  Suicidal Thoughts:No  Homicidal Thoughts:No   Sensorium  Memory: Immediate Good  Judgment: Fair  Insight: Fair   Art therapist  Concentration: Fair  Attention Span: Fair  Recall: [employer] of Knowledge: Fair  Language: Fair   Psychomotor Activity  Psychomotor Activity: Normal   Assets  Assets: Desire for Improvement; Social Support   Sleep  Sleep: Fair  Number of  hours:  7   Physical Exam: Physical Exam HENT:     Head: Normocephalic.     Nose: Nose normal.  Eyes:     Pupils: Pupils are equal, round, and reactive to light.  Cardiovascular:     Rate and Rhythm: Normal rate.  Pulmonary:     Effort: Pulmonary effort is normal.  Musculoskeletal:        Charles: Normal range of motion.      Cervical back: Normal range of motion.  Neurological:     Charles: No focal deficit present.     Mental Status: He is alert.  Psychiatric:        Mood and Affect: Mood normal.        Thought Content: Thought content normal.    Review of Systems  Constitutional: Negative.   HENT: Negative.    Eyes: Negative.   Respiratory: Negative.    Cardiovascular: Negative.   Gastrointestinal: Negative.   Genitourinary: Negative.   Musculoskeletal: Negative.   Skin: Negative.   Neurological: Negative.   Psychiatric/Behavioral:  Positive for depression. The patient is nervous/anxious.    Blood pressure (!) 128/96, pulse (!) 104, temperature 98.6 F (37 C), temperature source Oral, resp. rate 18, SpO2 100%. There is no height or weight on file to calculate BMI.  Musculoskeletal: Strength & Muscle Tone: within normal limits Gait & Station: normal Patient leans: N/A   [facility] MSE Discharge Disposition for Follow up and Recommendations: Based on my evaluation the patient does not appear to have an emergency medical condition and can be discharged with resources and follow up care in outpatient services for Medication Management and Individual Therapy   Gaither Pouch, NP 08/13/2024, 4:11 AM
